# Patient Record
Sex: Male | Born: 2000 | Race: White | Hispanic: No | Marital: Single | Smoking: Never smoker
Health system: Southern US, Community
[De-identification: ages and names within clinical notes are randomized; demographics above are authoritative.]

## PROBLEM LIST (undated history)

## (undated) DIAGNOSIS — K589 Irritable bowel syndrome without diarrhea: Secondary | ICD-10-CM

## (undated) HISTORY — DX: Irritable bowel syndrome without diarrhea: K58.9

## (undated) HISTORY — PX: MYRINGOTOMY: SUR874

---
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
## 2020-03-17 ENCOUNTER — Emergency Department (INDEPENDENT_AMBULATORY_CARE_PROVIDER_SITE_OTHER)
Admission: EM | Admit: 2020-03-17 | Discharge: 2020-03-17 | Disposition: A | Discharge status: Home / Self Care | Payer: Managed Care, Other (non HMO) | Source: Home / Self Care | Attending: Family Medicine | Admitting: Family Medicine

## 2020-03-17 ENCOUNTER — Other Ambulatory Visit: Payer: Self-pay

## 2020-03-17 DIAGNOSIS — K589 Irritable bowel syndrome without diarrhea: Secondary | ICD-10-CM

## 2020-03-17 DIAGNOSIS — I861 Scrotal varices: Secondary | ICD-10-CM

## 2020-03-17 DIAGNOSIS — S76211A Strain of adductor muscle, fascia and tendon of right thigh, initial encounter: Secondary | ICD-10-CM

## 2020-03-17 MED ORDER — DICYCLOMINE HCL 20 MG PO TABS: ORAL_TABLET | ORAL | 0 refills | Status: AC

## 2020-03-17 NOTE — Discharge Instructions (Signed)
Apply ice pack to right groin for 20 to 30 minutes, 3 to 4 times daily  Continue until pain and swelling decrease. May take Ibuprofen 200mg , 4 tabs every 8 hours with food.  Limit treadmill activity until improved.  Begin range of motion and stretching exercises as tolerated.

## 2020-03-17 NOTE — ED Triage Notes (Signed)
Patient presents to Urgent Care with complaints of pelvis pain since last night and abdominal pain for a year. Patient reports he thinks it is the food he eats.  Pt denies urinary sx or unprotected sex.

## 2020-03-17 NOTE — ED Provider Notes (Signed)
[facility]    CSN: [id number] Arrival date & time: 03/17/20  1305      History   Chief Complaint Chief Complaint  Patient presents with  . Pelvic Pain    HPI William Atkinson is a 19 y.o. male.   Patient presents with 3 complaints: 1)  For multiple years he has had vague abdominal discomfort after eating, often resulting in increased frequency of bowel movements.  He denies nausea/vomiting and significant changes in the pattern of his bowel movements.  He has a family history of IBS (maternal grandfather and maternal uncle). 2)  Last night he noticed right inguinal pain with movement that lasted about an hour.  He denies swelling and recalls no injury. 3)  For several months he has had a nontender "growth" in his scrotum.  He denies urinary changes.   Pelvic Pain Associated symptoms include abdominal pain.    History reviewed. No pertinent past medical history.  Patient Active Problem List   Diagnosis Date Noted  . Well adolescent visit 03/23/2015    Past Surgical History:  Procedure Laterality Date  . MYRINGOTOMY         Home Medications    Prior to Admission medications   Medication Sig Start Date End Date Taking? Authorizing Provider  dicyclomine (BENTYL) 20 MG tablet Take one tab PO BID to TID for irritable bowel 03/17/20   Lattie Haw, MD  ibuprofen (ADVIL,MOTRIN) 600 MG tablet Take 600 mg by mouth every 6 (six) hours as needed.    [provider]    Family History Family History  Problem Relation Age of Onset  . Hyperlipidemia Other   . Hyperlipidemia Other   . Hyperlipidemia Other   . Hyperlipidemia Other   . Healthy Mother   . Healthy Father     Social History Social History   Tobacco Use  . Smoking status: Never Smoker  . Smokeless tobacco: Never Used  Substance Use Topics  . Alcohol use: Yes    Alcohol/week: 2.0 standard drinks    Types: 2 Standard drinks or equivalent per week    Comment: occasionally  . Drug  use: No     Allergies   Patient has no known allergies.   Review of Systems Review of Systems  Constitutional: Negative for activity change, appetite change, chills, diaphoresis, fatigue, fever and unexpected weight change.  HENT: Negative.   Eyes: Negative.   Respiratory: Negative.   Cardiovascular: Negative.   Gastrointestinal: Positive for abdominal pain. Negative for abdominal distention, blood in stool, constipation, diarrhea, nausea, rectal pain and vomiting.  Genitourinary: Positive for pelvic pain and scrotal swelling. Negative for difficulty urinating, discharge, frequency, genital sores, hematuria, testicular pain and urgency.  Musculoskeletal: Negative.   Skin: Negative.   Neurological: Negative.   Hematological: Negative.      Physical Exam Triage Vital Signs ED Triage Vitals  Enc Vitals Group     BP 03/17/20 1322 (!) 152/80     Pulse Rate 03/17/20 1322 76     Resp 03/17/20 1322 16     Temp 03/17/20 1322 98.1 F (36.7 C)     Temp Source 03/17/20 1322 Oral     SpO2 03/17/20 1322 100 %     Weight --      Height --      Head Circumference --      Peak Flow --      Pain Score 03/17/20 1321 0     Pain Loc --  Pain Edu? --      Excl. in GC? --    No data found.  Updated Vital Signs BP (!) 152/80 (BP Location: Right Arm)   Pulse 76   Temp 98.1 F (36.7 C) (Oral)   Resp 16   SpO2 100%   Visual Acuity Right Eye Distance:   Left Eye Distance:   Bilateral Distance:    Right Eye Near:   Left Eye Near:    Bilateral Near:     Physical Exam Vitals and nursing note reviewed.  Constitutional:      General: He is not in acute distress.    Appearance: He is not ill-appearing.  HENT:     Head: Normocephalic.     Right Ear: External ear normal.     Left Ear: External ear normal.     Nose: Nose normal.     Mouth/Throat:     Pharynx: Oropharynx is clear.  Eyes:     Conjunctiva/sclera: Conjunctivae normal.     Pupils: Pupils are equal, round, and  reactive to light.  Cardiovascular:     Rate and Rhythm: Normal rate and regular rhythm.     Heart sounds: Normal heart sounds.  Pulmonary:     Breath sounds: Normal breath sounds.  Abdominal:     Palpations: Abdomen is soft. There is no mass.     Tenderness: There is no abdominal tenderness. There is no right CVA tenderness or left CVA tenderness.     Hernia: No hernia is present. There is no hernia in the left inguinal area or right inguinal area.  Genitourinary:    Penis: Normal. No tenderness, discharge, swelling or lesions.      Testes:        Right: Mass, tenderness or swelling not present.        Left: Varicocele present. Mass, tenderness or swelling not present.     Epididymis:     Right: Normal.     Left: Normal.       Comments: No hernias palpated. There is a prominent left varicocele present without tenderness to palpation. Musculoskeletal:     Cervical back: Neck supple.     Right lower leg: No edema.     Left lower leg: No edema.  Lymphadenopathy:     Cervical: No cervical adenopathy.  Skin:    General: Skin is warm and dry.     Findings: No rash.          Comments: There is tenderness to palpation over the right inguinal area without evidence of direct hernia or adenopathy.  Pain is elicited by resisted flexion of the right hip.  Neurological:     Mental Status: He is alert and oriented to person, place, and time.      UC Treatments / Results  Labs (all labs ordered are listed, but only abnormal results are displayed) Labs Reviewed - No data to display  EKG   Radiology No results found.  Procedures Procedures (including critical care time)  Medications Ordered in UC Medications - No data to display  Initial Impression / Assessment and Plan / UC Course  I have reviewed the triage vital signs and the nursing notes.  Pertinent labs & imaging results that were available during my care of the patient were reviewed by me and considered in my medical  decision making (see chart for details).    Benign abdominal exam; note family history of IBS.  Begin trial of Bentyl. Recommend follow-up with urologist for  evaluation of varicocele. Recommend follow-up with Dr. Rodney Langton for management of IBS and follow-up of groin strain.   Final Clinical Impressions(s) / UC Diagnoses   Final diagnoses:  Irritable bowel syndrome, unspecified type  Left varicocele  Strain of groin, right, initial encounter     Discharge Instructions     Apply ice pack to right groin for 20 to 30 minutes, 3 to 4 times daily  Continue until pain and swelling decrease. May take Ibuprofen 200mg , 4 tabs every 8 hours with food.  Limit treadmill activity until improved.  Begin range of motion and stretching exercises as tolerated.    ED Prescriptions    Medication Sig Dispense Auth. Provider   dicyclomine (BENTYL) 20 MG tablet Take one tab PO BID to TID for irritable bowel 30 tablet , MD        Lattie Haw, MD 03/23/20 [phone]

## 2020-04-03 ENCOUNTER — Other Ambulatory Visit: Payer: Self-pay

## 2020-04-03 ENCOUNTER — Encounter: Payer: Self-pay | Admitting: Family Medicine

## 2020-04-03 ENCOUNTER — Ambulatory Visit (INDEPENDENT_AMBULATORY_CARE_PROVIDER_SITE_OTHER): Payer: Managed Care, Other (non HMO) | Admitting: Family Medicine

## 2020-04-03 VITALS — BP 140/87 | HR 72 | Temp 98.2°F | Ht 70.47 in | Wt 144.7 lb

## 2020-04-03 DIAGNOSIS — E7801 Familial hypercholesterolemia: Secondary | ICD-10-CM | POA: Diagnosis not present

## 2020-04-03 DIAGNOSIS — K589 Irritable bowel syndrome without diarrhea: Secondary | ICD-10-CM | POA: Diagnosis not present

## 2020-04-03 DIAGNOSIS — Z0000 Encounter for general adult medical examination without abnormal findings: Secondary | ICD-10-CM | POA: Diagnosis not present

## 2020-04-03 DIAGNOSIS — Z13220 Encounter for screening for lipoid disorders: Secondary | ICD-10-CM | POA: Diagnosis not present

## 2020-04-03 NOTE — Assessment & Plan Note (Signed)
He will continue with dicyclomine as needed.

## 2020-04-03 NOTE — Assessment & Plan Note (Signed)
Well adult Orders Placed This Encounter  Procedures  . COMPLETE METABOLIC PANEL WITH GFR  . CBC  . Lipid Profile  Immunizations:  COVID and Flu vaccines discussed.  He declines.  Screening: Lipid  Anticipatory guidance/Risk factor reduction:  Recommendations per AVS.

## 2020-04-03 NOTE — Patient Instructions (Signed)
Nice to meet you today! I would strongly encourage you to have COVID vaccine Have labs completed.     Preventive Care 21-21 Years Old, Male Preventive care refers to lifestyle choices and visits with your health care provider that can promote health and wellness. This includes:  A yearly physical exam. This is also called an annual well check.  Regular dental and eye exams.  Immunizations.  Screening for certain conditions.  Healthy lifestyle choices, such as eating a healthy diet, getting regular exercise, not using drugs or products that contain nicotine and tobacco, and limiting alcohol use. What can I expect for my preventive care visit? Physical exam Your health care provider will check:  Height and weight. These may be used to calculate body mass index (BMI), which is a measurement that tells if you are at a healthy weight.  Heart rate and blood pressure.  Your skin for abnormal spots. Counseling Your health care provider may ask you questions about:  Alcohol, tobacco, and drug use.  Emotional well-being.  Home and relationship well-being.  Sexual activity.  Eating habits.  Work and work Statistician. What immunizations do I need?  Influenza (flu) vaccine  This is recommended every year. Tetanus, diphtheria, and pertussis (Tdap) vaccine  You may need a Td booster every 10 years. Varicella (chickenpox) vaccine  You may need this vaccine if you have not already been vaccinated. Human papillomavirus (HPV) vaccine  If recommended by your health care provider, you may need three doses over 6 months. Measles, mumps, and rubella (MMR) vaccine  You may need at least one dose of MMR. You may also need a second dose. Meningococcal conjugate (MenACWY) vaccine  One dose is recommended if you are 57-76 years old and a Market researcher living in a residence hall, or if you have one of several medical conditions. You may also need additional booster  doses. Pneumococcal conjugate (PCV13) vaccine  You may need this if you have certain conditions and were not previously vaccinated. Pneumococcal polysaccharide (PPSV23) vaccine  You may need one or two doses if you smoke cigarettes or if you have certain conditions. Hepatitis A vaccine  You may need this if you have certain conditions or if you travel or work in places where you may be exposed to hepatitis A. Hepatitis B vaccine  You may need this if you have certain conditions or if you travel or work in places where you may be exposed to hepatitis B. Haemophilus influenzae type b (Hib) vaccine  You may need this if you have certain risk factors. You may receive vaccines as individual doses or as more than one vaccine together in one shot (combination vaccines). Talk with your health care provider about the risks and benefits of combination vaccines. What tests do I need? Blood tests  Lipid and cholesterol levels. These may be checked every 5 years starting at age 60.  Hepatitis C test.  Hepatitis B test. Screening   Diabetes screening. This is done by checking your blood sugar (glucose) after you have not eaten for a while (fasting).  Sexually transmitted disease (STD) testing. Talk with your health care provider about your test results, treatment options, and if necessary, the need for more tests. Follow these instructions at home: Eating and drinking   Eat a diet that includes fresh fruits and vegetables, whole grains, lean protein, and low-fat dairy products.  Take vitamin and mineral supplements as recommended by your health care provider.  Do not drink alcohol if your  health care provider tells you not to drink.  If you drink alcohol: ? Limit how much you have to 0-2 drinks a day. ? Be aware of how much alcohol is in your drink. In the [place] one drink equals one 12 oz bottle of beer (355 mL), one 5 oz glass of wine (148 mL), or one 1 oz glass of hard liquor (44  mL). Lifestyle  Take daily care of your teeth and gums.  Stay active. Exercise for at least 30 minutes on 5 or more days each week.  Do not use any products that contain nicotine or tobacco, such as cigarettes, e-cigarettes, and chewing tobacco. If you need help quitting, ask your health care provider.  If you are sexually active, practice safe sex. Use a condom or other form of protection to prevent STIs (sexually transmitted infections). What's next?  Go to your health care provider once a year for a well check visit.  Ask your health care provider how often you should have your eyes and teeth checked.  Stay up to date on all vaccines. This information is not intended to replace advice given to you by your health care provider. Make sure you discuss any questions you have with your health care provider. Document Revised: 07/12/2018 Document Reviewed: 07/12/2018 Elsevier Patient Education  2020 [employer].

## 2020-04-03 NOTE — Progress Notes (Signed)
William Atkinson - 19 y.o. male MRN [id number]  Date of birth: 2001-08-01  Subjective Chief Complaint  Patient presents with  . Establish Care    HPI William Atkinson is a 19 y.o. male here today for annual exam.  He has been in pretty good health other than history if IBS.  IBS has been well controlled with dicyclomine as needed.  He does have a family history of HLD in several family members and early heart disease.  He would like to have labs completed today.   He denies nicotine use.  He consumes a few beers on the weekends.  Denies drug use at this time.   He exercises intermittently and follows a pretty healthy diet.   Review of Systems  Constitutional: Negative for chills, fever, malaise/fatigue and weight loss.  HENT: Negative for congestion, ear pain and sore throat.   Eyes: Negative for blurred vision, double vision and pain.  Respiratory: Negative for cough and shortness of breath.   Cardiovascular: Negative for chest pain and palpitations.  Gastrointestinal: Negative for abdominal pain, blood in stool, constipation, heartburn and nausea.  Genitourinary: Negative for dysuria and urgency.  Musculoskeletal: Negative for joint pain and myalgias.  Neurological: Negative for dizziness and headaches.  Endo/Heme/Allergies: Does not bruise/bleed easily.  Psychiatric/Behavioral: Negative for depression. The patient is not nervous/anxious and does not have insomnia.     No Known Allergies  Past Medical History:  Diagnosis Date  . IBS (irritable bowel syndrome)     Past Surgical History:  Procedure Laterality Date  . MYRINGOTOMY      Social History   Socioeconomic History  . Marital status: Single    Spouse name: Not on file  . Number of children: Not on file  . Years of education: Not on file  . Highest education level: Not on file  Occupational History  . Not on file  Tobacco Use  . Smoking status: Never Smoker  . Smokeless tobacco: Never Used  Vaping Use  . Vaping  Use: Never used  Substance and Sexual Activity  . Alcohol use: Yes    Alcohol/week: 2.0 standard drinks    Types: 2 Standard drinks or equivalent per week    Comment: occasionally  . Drug use: Not Currently    Types: Marijuana  . Sexual activity: Yes    Partners: Female    Birth control/protection: Condom  Other Topics Concern  . Not on file  Social History Narrative  . Not on file   Social Determinants of Health   Financial Resource Strain:   . Difficulty of Paying Living Expenses: Not on file  Food Insecurity:   . Worried About Programme researcher, broadcasting/film/video in the Last Year: Not on file  . Ran Out of Food in the Last Year: Not on file  Transportation Needs:   . Lack of Transportation (Medical): Not on file  . Lack of Transportation (Non-Medical): Not on file  Physical Activity:   . Days of Exercise per Week: Not on file  . Minutes of Exercise per Session: Not on file  Stress:   . Feeling of Stress : Not on file  Social Connections:   . Frequency of Communication with Friends and Family: Not on file  . Frequency of Social Gatherings with Friends and Family: Not on file  . Attends Religious Services: Not on file  . Active Member of Clubs or Organizations: Not on file  . Attends Banker Meetings: Not on file  . Marital Status:  Not on file    Family History  Problem Relation Age of Onset  . Hyperlipidemia Other   . Hyperlipidemia Other   . Hyperlipidemia Other   . Hyperlipidemia Other   . Healthy Mother   . Heart attack Father   . Cancer Paternal Grandmother   . Heart attack Paternal Grandfather     Health Maintenance  Topic Date Due  . Hepatitis C Screening  Never done  . HIV Screening  Never done  . INFLUENZA VACCINE  03/01/2020  . TETANUS/TDAP  03/23/2020  . COVID-19 Vaccine (1) 08/01/2020 (Originally 11/04/2010)      ----------------------------------------------------------------------------------------------------------------------------------------------------------------------------------------------------------------- Physical Exam BP 140/87 (BP Location: Left Arm, Patient Position: Sitting, Cuff Size: Normal)   Pulse 72   Temp 98.2 F (36.8 C) (Temporal)   Ht 5' 10.47" (1.79 m)   Wt 144 lb 11.2 oz (65.6 kg)   SpO2 100%   BMI 20.49 kg/m   Physical Exam Constitutional:      General: He is not in acute distress. HENT:     Head: Normocephalic and atraumatic.     Right Ear: External ear normal.     Left Ear: External ear normal.     Mouth/Throat:     Mouth: Mucous membranes are moist.  Eyes:     General: No scleral icterus. Neck:     Thyroid: No thyromegaly.  Cardiovascular:     Rate and Rhythm: Normal rate and regular rhythm.     Heart sounds: Normal heart sounds.  Pulmonary:     Effort: Pulmonary effort is normal.     Breath sounds: Normal breath sounds.  Abdominal:     General: Bowel sounds are normal. There is no distension.     Palpations: Abdomen is soft.     Tenderness: There is no abdominal tenderness. There is no guarding.  Musculoskeletal:     Cervical back: Normal range of motion.  Lymphadenopathy:     Cervical: No cervical adenopathy.  Skin:    General: Skin is warm and dry.     Findings: No rash.  Neurological:     Mental Status: He is alert and oriented to person, place, and time.     Cranial Nerves: No cranial nerve deficit.     Motor: No abnormal muscle tone.  Psychiatric:        Behavior: Behavior normal.     ------------------------------------------------------------------------------------------------------------------------------------------------------------------------------------------------------------------- Assessment and Plan  Well adult exam Well adult Orders Placed This Encounter  Procedures  . COMPLETE METABOLIC PANEL WITH GFR  .  CBC  . Lipid Profile  Immunizations:  COVID and Flu vaccines discussed.  He declines.  Screening: Lipid  Anticipatory guidance/Risk factor reduction:  Recommendations per AVS.   IBS (irritable bowel syndrome) He will continue with dicyclomine as needed.    No orders of the defined types were placed in this encounter.   No follow-ups on file.    This visit occurred during the SARS-CoV-2 public health emergency.  Safety protocols were in place, including screening questions prior to the visit, additional usage of staff PPE, and extensive cleaning of exam room while observing appropriate contact time as indicated for disinfecting solutions.

## 2020-04-17 LAB — LIPID PANEL
Cholesterol: 128 mg/dL (ref <200)
HDL: 51 mg/dL (ref >=40)
LDL Cholesterol (Calc): 64 mg/dL (calc)
Non-HDL Cholesterol (Calc): 77 mg/dL (calc) (ref <130)
Total CHOL/HDL Ratio: 2.5 (calc) (ref <5.0)
Triglycerides: 58 mg/dL (ref <150)

## 2020-04-17 LAB — COMPLETE METABOLIC PANEL WITH GFR
AG Ratio: 2 (calc) (ref 1.0–2.5)
ALT: 44 U/L (ref 9–46)
AST: 21 U/L (ref 10–40)
Albumin: 4.5 g/dL (ref 3.6–5.1)
Alkaline phosphatase (APISO): 115 U/L (ref 36–130)
BUN: 17 mg/dL (ref 7–25)
CO2: 29 mmol/L (ref 20–32)
Calcium: 9.8 mg/dL (ref 8.6–10.3)
Chloride: 103 mmol/L (ref 98–110)
Creat: 0.94 mg/dL (ref 0.60–1.35)
GFR, Est African American: 134 mL/min/{1.73_m2} (ref >=60)
GFR, Est Non African American: 115 mL/min/{1.73_m2} (ref >=60)
Globulin: 2.3 g/dL (calc) (ref 1.9–3.7)
Glucose, Bld: 82 mg/dL (ref 65–139)
Potassium: 4.3 mmol/L (ref 3.5–5.3)
Sodium: 139 mmol/L (ref 135–146)
Total Bilirubin: 0.5 mg/dL (ref 0.2–1.2)
Total Protein: 6.8 g/dL (ref 6.1–8.1)

## 2020-04-17 LAB — CBC
HCT: 44.6 % (ref 38.5–50.0)
Hemoglobin: 15.3 g/dL (ref 13.2–17.1)
MCH: 30.1 pg (ref 27.0–33.0)
MCHC: 34.3 g/dL (ref 32.0–36.0)
MCV: 87.6 fL (ref 80.0–100.0)
MPV: 9 fL (ref 7.5–12.5)
Platelets: 224 10*3/uL (ref 140–400)
RBC: 5.09 10*6/uL (ref 4.20–5.80)
RDW: 11.5 % (ref 11.0–15.0)
WBC: 4.4 10*3/uL (ref 3.8–10.8)
